# Patient Record
Sex: Female | Born: 1940 | Race: White | Hispanic: No | Smoking: Current every day smoker
Health system: Southern US, Community
[De-identification: ages and names within clinical notes are randomized; demographics above are authoritative.]

## PROBLEM LIST (undated history)

## (undated) DIAGNOSIS — E785 Hyperlipidemia, unspecified: Secondary | ICD-10-CM

## (undated) DIAGNOSIS — C801 Malignant (primary) neoplasm, unspecified: Secondary | ICD-10-CM

## (undated) HISTORY — PX: COLON SURGERY: SHX602

## (undated) HISTORY — PX: CHOLECYSTECTOMY: SHX55

## (undated) HISTORY — PX: APPENDECTOMY: SHX54

## (undated) HISTORY — PX: ABDOMINAL HYSTERECTOMY: SHX81

---

## 2016-05-21 ENCOUNTER — Emergency Department (INDEPENDENT_AMBULATORY_CARE_PROVIDER_SITE_OTHER)
Admission: EM | Admit: 2016-05-21 | Discharge: 2016-05-21 | Disposition: A | Discharge status: Home / Self Care | Payer: Medicare Other | Source: Home / Self Care | Attending: Family Medicine | Admitting: Family Medicine

## 2016-05-21 ENCOUNTER — Encounter: Payer: Self-pay | Admitting: *Deleted

## 2016-05-21 DIAGNOSIS — R300 Dysuria: Secondary | ICD-10-CM

## 2016-05-21 DIAGNOSIS — N898 Other specified noninflammatory disorders of vagina: Secondary | ICD-10-CM

## 2016-05-21 HISTORY — DX: Hyperlipidemia, unspecified: E78.5

## 2016-05-21 LAB — POCT URINALYSIS DIP (MANUAL ENTRY)
Blood, UA: NEGATIVE
Glucose, UA: NEGATIVE
Leukocytes, UA: NEGATIVE
Nitrite, UA: NEGATIVE
Protein Ur, POC: 30 — AB
Spec Grav, UA: 1.025 (ref 1.005–1.03)
Urobilinogen, UA: 0.2 (ref 0–1)
pH, UA: 5.5 (ref 5–8)

## 2016-05-21 MED ORDER — ONDANSETRON HCL 4 MG PO TABS: 4.0000 mg | ORAL_TABLET | Freq: Four times a day (QID) | ORAL | 0 refills | Status: DC

## 2016-05-21 MED ORDER — CEPHALEXIN 500 MG PO CAPS
500.0000 mg | ORAL_CAPSULE | Freq: Two times a day (BID) | ORAL | 0 refills | Status: AC
Start: 2016-05-21 — End: 2016-05-28

## 2016-05-21 MED ORDER — ONDANSETRON 4 MG PO TBDP: 4.0000 mg | ORAL_TABLET | Freq: Once | ORAL | Status: DC

## 2016-05-21 MED ORDER — FLUCONAZOLE 200 MG PO TABS: 200.0000 mg | ORAL_TABLET | Freq: Once | ORAL | 0 refills | Status: AC

## 2016-05-21 NOTE — ED Provider Notes (Signed)
CSN: [id number]     Arrival date & time 05/21/16  1430 History   First MD Initiated Contact with Patient 05/21/16 1500     Chief Complaint  Patient presents with  . Dysuria   (Consider location/radiation/quality/duration/timing/severity/associated sxs/prior Treatment) HPI Shari Hess is a 75 y.o. female presenting to UC with c/o dysuria, nausea, and chills for 6 days as well as scant amount of brown discharge and vaginal burning/irritation.  She did apply vagisil last night with temporary relief.  She reports hx of UTIs in the past several years ago.  Denies fever, vomiting or diarrhea. Denies hematuria.  Denies known hx of yeast infections.  Denies abdominal pain or back pain.   Past Medical History:  Diagnosis Date  . Hyperlipidemia    Past Surgical History:  Procedure Laterality Date  . ABDOMINAL HYSTERECTOMY    . APPENDECTOMY    . CHOLECYSTECTOMY    . COLON SURGERY     History reviewed. No pertinent family history. Social History  Substance Use Topics  . Smoking status: Current Every Day Smoker    Packs/day: 1.00    Types: Cigarettes  . Smokeless tobacco: Never Used  . Alcohol use No   OB History    No data available     Review of Systems  Constitutional: Positive for chills. Negative for appetite change, fatigue and fever.  HENT: Negative for congestion, rhinorrhea and sore throat.   Respiratory: Negative for cough and chest tightness.   Cardiovascular: Negative for chest pain and palpitations.  Gastrointestinal: Positive for nausea. Negative for abdominal pain, diarrhea and vomiting.  Genitourinary: Positive for decreased urine volume, dysuria, frequency, urgency, vaginal discharge and vaginal pain ( burning, irritation). Negative for hematuria, pelvic pain and vaginal bleeding.  Musculoskeletal: Negative for back pain and myalgias.    Allergies  Codeine  Home Medications   Prior to Admission medications   Medication Sig Start Date End Date Taking?  Authorizing Provider  acetaminophen (TYLENOL) 325 MG tablet Take 650 mg by mouth every 6 (six) hours as needed.   Yes Historical Provider, MD  atorvastatin (LIPITOR) 40 MG tablet Take 40 mg by mouth daily.   Yes Historical Provider, MD  clonazePAM (KLONOPIN) 0.5 MG tablet Take 0.5 mg by mouth 2 (two) times daily as needed for anxiety.   Yes Historical Provider, MD  mirtazapine (REMERON) 15 MG tablet Take 15 mg by mouth at bedtime.   Yes Historical Provider, MD  cephALEXin (KEFLEX) 500 MG capsule Take 1 capsule (500 mg total) by mouth 2 (two) times daily. 05/21/16 05/28/16  Noland Fordyce, PA-C  fluconazole (DIFLUCAN) 200 MG tablet Take 1 tablet (200 mg total) by mouth once. 05/21/16 05/21/16  Noland Fordyce, PA-C  ondansetron (ZOFRAN) 4 MG tablet Take 1 tablet (4 mg total) by mouth every 6 (six) hours. 05/21/16   Noland Fordyce, PA-C   Meds Ordered and Administered this Visit   Medications  ondansetron (ZOFRAN-ODT) disintegrating tablet 4 mg (not administered)    BP 128/76 (BP Location: Left Arm)   Pulse 92   Temp 98 F (36.7 C) (Oral)   Resp 18   Ht 5\' 6"  (1.676 m)   Wt 131 lb (59.4 kg)   SpO2 95%   BMI 21.14 kg/m  No data found.   Physical Exam  Constitutional: She is oriented to person, place, and time. She appears well-developed and well-nourished. No distress.  Pt sitting in exam chair, NAD. Appears well, non-toxic.  HENT:  Head: Normocephalic and atraumatic.  Eyes: EOM  are normal.  Neck: Normal range of motion.  Cardiovascular: Normal rate and regular rhythm.   Pulmonary/Chest: Effort normal and breath sounds normal. No respiratory distress. She has no wheezes. She has no rales.  Abdominal: Soft. She exhibits no distension. There is no tenderness. There is no CVA tenderness.  Musculoskeletal: Normal range of motion.  Neurological: She is alert and oriented to person, place, and time.  Skin: Skin is warm and dry. She is not diaphoretic.  Psychiatric: She has a normal mood and  affect. Her behavior is normal.  Nursing note and vitals reviewed.   Urgent Care Course   Clinical Course    Procedures (including critical care time)  Labs Review Labs Reviewed  POCT URINALYSIS DIP (MANUAL ENTRY) - Abnormal; Notable for the following:       Result Value   Bilirubin, UA small (*)    Ketones, POC UA trace (5) (*)    Protein Ur, POC =30 (*)    All other components within normal limits  URINE CULTURE    Imaging Review No results found.    MDM   1. Dysuria   2. Vaginal irritation    Pt c/o 6 days of vaginal irritation and dysuria as well as chills and nausea.  UA: small amount of protein and bilirubin, trace ketones Will send urine culture. Will start pt on antibiotics as urine culture pending as pt is symptomatic at this time. Will also cover for potential vaginal yeast infection. Rx: Keflex and fluconazole 200mg  tab once.  Encouraged good hydration. F/u with PCP in 3-4 days or return to [facility] if needed if symptoms not improving, sooner if worsening. Patient verbalized understanding and agreement with treatment plan.     Noland Fordyce, PA-C 05/21/16 1614

## 2016-05-21 NOTE — ED Triage Notes (Signed)
Pt c/o dysuria, nausea and chills x 6 days. She reports some brown d/c. She has applied vagisil cream with temporary relief.

## 2016-05-23 ENCOUNTER — Telehealth: Payer: Self-pay | Admitting: *Deleted

## 2016-05-23 LAB — URINE CULTURE: Organism ID, Bacteria: NO GROWTH

## 2016-05-23 NOTE — Telephone Encounter (Signed)
Callback: UCX results given and discussed. She is improving. May stop antibiotic as symptoms possibly caused by yeast infection.

## 2017-02-10 ENCOUNTER — Emergency Department (INDEPENDENT_AMBULATORY_CARE_PROVIDER_SITE_OTHER)
Admission: EM | Admit: 2017-02-10 | Discharge: 2017-02-10 | Disposition: A | Discharge status: Home / Self Care | Payer: Medicare Other | Source: Home / Self Care | Attending: Family Medicine | Admitting: Family Medicine

## 2017-02-10 ENCOUNTER — Encounter: Payer: Self-pay | Admitting: *Deleted

## 2017-02-10 DIAGNOSIS — R1084 Generalized abdominal pain: Secondary | ICD-10-CM | POA: Diagnosis not present

## 2017-02-10 DIAGNOSIS — N898 Other specified noninflammatory disorders of vagina: Secondary | ICD-10-CM

## 2017-02-10 DIAGNOSIS — R102 Pelvic and perineal pain: Secondary | ICD-10-CM

## 2017-02-10 HISTORY — DX: Malignant (primary) neoplasm, unspecified: C80.1

## 2017-02-10 NOTE — ED Provider Notes (Signed)
CSN: [id number]     Arrival date & time 02/10/17  1341 History   None    Chief Complaint  Patient presents with  . Abdominal Pain  . Vaginal Discharge  . Nausea   (Consider location/radiation/quality/duration/timing/severity/associated sxs/prior Treatment) HPI  Shari Hess is a 76 y.o. female with hx of Right lower lobe lung cancer presenting to UC with c/o 2 weeks of gradually worsening diffuse abdominal pain that started on Left flank, radiated across left side of abdomen, now starting on Right side of abdomen.  She completed her radiation treatment end of March, early April of this year and is scheduled to have a repeat PET scan at the end of June but pain became too severe to wait.  She also reports pressure and discomfort in her pelvic region and irritation when wiping in vaginal area when washing herself or after urination.  The pelvic pressure and vaginal irritation has been for about 3-4 months. Her PCP recommended she f/u with a GYN but she has not been able to schedule an appointment yet.  Denies fever, chills, vomiting or diarrhea but does have nausea.     Past Medical History:  Diagnosis Date  . Cancer ([place])    lung CA  . Hyperlipidemia    Past Surgical History:  Procedure Laterality Date  . ABDOMINAL HYSTERECTOMY    . APPENDECTOMY    . CHOLECYSTECTOMY    . COLON SURGERY     Family History  Problem Relation Age of Onset  . Stroke Mother   . Diabetes Father   . Heart attack Father    Social History  Substance Use Topics  . Smoking status: Current Every Day Smoker    Packs/day: 0.50    Types: Cigarettes  . Smokeless tobacco: Never Used  . Alcohol use No   OB History    No data available     Review of Systems  Constitutional: Negative for chills and fever.  HENT: Negative for congestion, ear pain, sore throat, trouble swallowing and voice change.   Respiratory: Negative for cough and shortness of breath.   Cardiovascular: Negative for chest pain and  palpitations.  Gastrointestinal: Positive for abdominal pain and nausea. Negative for diarrhea and vomiting.  Genitourinary: Positive for dysuria, flank pain (Left), genital sores (discomfort, irritation when wiping), pelvic pain (pressure) and vaginal pain. Negative for decreased urine volume, hematuria and urgency.  Musculoskeletal: Negative for arthralgias, back pain and myalgias.  Skin: Negative for rash.    Allergies  Codeine  Home Medications   Prior to Admission medications   Medication Sig Start Date End Date Taking? Authorizing Provider  acetaminophen (TYLENOL) 325 MG tablet Take 650 mg by mouth every 6 (six) hours as needed.   Yes [provider]  aspirin EC 81 MG tablet Take 81 mg by mouth daily.   Yes [provider]  atorvastatin (LIPITOR) 40 MG tablet Take 40 mg by mouth daily.   Yes [provider]  clonazePAM (KLONOPIN) 0.5 MG tablet Take 0.5 mg by mouth 2 (two) times daily as needed for anxiety.   Yes [provider]   Meds Ordered and Administered this Visit  Medications - No data to display  BP 123/73 (BP Location: Left Arm)   Pulse 99   Temp 98.4 F (36.9 C) (Oral)   Wt 125 lb (56.7 kg)   SpO2 95%   BMI 20.18 kg/m  No data found.   Physical Exam  Constitutional: She is oriented to person, place, and  time. She appears well-developed and well-nourished. No distress.  HENT:  Head: Normocephalic and atraumatic.  Mouth/Throat: Oropharynx is clear and moist.  Eyes: EOM are normal.  Neck: Normal range of motion.  Cardiovascular: Normal rate and regular rhythm.   Pulmonary/Chest: Effort normal. No respiratory distress. She has no wheezes. She has no rales.  Abdominal: Soft. Bowel sounds are normal. She exhibits no distension and no mass. There is tenderness. There is guarding. There is no rebound and no CVA tenderness.    Soft, non-distended. Diffuse tenderness across abdomen.   Musculoskeletal: Normal range of motion.    Neurological: She is alert and oriented to person, place, and time.  Skin: Skin is warm and dry. No rash noted. She is not diaphoretic. No erythema.  Psychiatric: She has a normal mood and affect. Her behavior is normal.  Nursing note and vitals reviewed.   Urgent Care Course     Procedures (including critical care time)  Labs Review Labs Reviewed - No data to display  Imaging Review No results found.   MDM   1. Generalized abdominal pain   2. Pelvic pain   3. Vaginal irritation    Pt c/o diffuse abdominal pain with nausea.  Diffuse tenderness to abdomen on exam.  Recommend pt go to emergency department for further evaluation, likely via CT scan and pelvic exam.  Pt agreeable with plan.     Noland Fordyce, PA-C 02/10/17 1450

## 2017-02-10 NOTE — ED Triage Notes (Signed)
Patient c/o 2 weeks of superficial upper abdominal pain now on both sides. Constant nausea. Absence of rash. Pain is described as sharp. She also c/o vaginal pain and brown discharge. She saw her PCP 6 days ago who she reports advised her to see a GYN.

## 2017-02-11 ENCOUNTER — Telehealth: Payer: Self-pay | Admitting: *Deleted

## 2017-02-11 NOTE — Telephone Encounter (Signed)
Callback: Patient reports she did go to [facility] ER. Reports "they didn't find anything".They did a scan and labs. She does not know results yet. Prescribed lidocaine patches. She has an apt with a GYn in 1 1/2 weeks

## 2017-08-03 ENCOUNTER — Emergency Department (INDEPENDENT_AMBULATORY_CARE_PROVIDER_SITE_OTHER): Payer: Medicare Other

## 2017-08-03 ENCOUNTER — Encounter: Payer: Self-pay | Admitting: Emergency Medicine

## 2017-08-03 ENCOUNTER — Emergency Department (INDEPENDENT_AMBULATORY_CARE_PROVIDER_SITE_OTHER)
Admission: EM | Admit: 2017-08-03 | Discharge: 2017-08-03 | Disposition: A | Discharge status: Home / Self Care | Payer: Medicare Other | Source: Home / Self Care | Attending: Family Medicine | Admitting: Family Medicine

## 2017-08-03 DIAGNOSIS — J449 Chronic obstructive pulmonary disease, unspecified: Secondary | ICD-10-CM

## 2017-08-03 DIAGNOSIS — J029 Acute pharyngitis, unspecified: Secondary | ICD-10-CM

## 2017-08-03 DIAGNOSIS — J441 Chronic obstructive pulmonary disease with (acute) exacerbation: Secondary | ICD-10-CM | POA: Diagnosis not present

## 2017-08-03 MED ORDER — PREDNISONE 20 MG PO TABS: ORAL_TABLET | ORAL | 0 refills | Status: DC

## 2017-08-03 MED ORDER — AZITHROMYCIN 250 MG PO TABS: 250.0000 mg | ORAL_TABLET | Freq: Every day | ORAL | 0 refills | Status: DC

## 2017-08-03 NOTE — ED Triage Notes (Signed)
Patient presents to [facility] with C/O pain in the right rib area, times two weeks , she advises that she has had a productive cough with greenish yellow sputum. Nasal drainage, achy and generalized weakness. Dx of lung Cancer, was taking radiation until March. Patient continues to smoke 1/2 pack of cigarettes per day.

## 2017-08-03 NOTE — ED Provider Notes (Signed)
[name]    CSN: [id number] Arrival date & time: 08/03/17  1446     History   Chief Complaint Chief Complaint  Patient presents with  . Cough  . Lung Cancer    HPI Shari Hess is a 76 y.o. female.   HPI  Shari Hess is a 76 y.o. female presenting to UC with hx of COPD and lung cancer c/o worsening productive cough with greenish-yellow sputum for 2 weeks.  Associated sore throat, nasal drainage, generalized weakness and body aches.  She has taken OTC medications and her albuterol inhaler with mild relief.  Her last radiation treatment was in March 2018.  Last visit with oncology was in Sept 2018, a chest CT showed continued reduction in size of primary Right upper lobe malignant nodule.  Pt still smokes cigarettes daily but states she has not been able to smoke recently due to her chest congestion and cough.     Past Medical History:  Diagnosis Date  . Cancer ([place])    lung CA  . Hyperlipidemia     There are no active problems to display for this patient.   Past Surgical History:  Procedure Laterality Date  . ABDOMINAL HYSTERECTOMY    . APPENDECTOMY    . CHOLECYSTECTOMY    . COLON SURGERY      OB History    No data available       Home Medications    Prior to Admission medications   Medication Sig Start Date End Date Taking? Authorizing Provider  acetaminophen (TYLENOL) 325 MG tablet Take 650 mg by mouth every 6 (six) hours as needed.    [provider]  aspirin EC 81 MG tablet Take 81 mg by mouth daily.    [provider]  atorvastatin (LIPITOR) 40 MG tablet Take 40 mg by mouth daily.    [provider]  azithromycin (ZITHROMAX) 250 MG tablet Take 1 tablet (250 mg total) daily by mouth. Take first 2 tablets together, then 1 every day until finished. 08/03/17   Noe Gens, PA-C  clonazePAM (KLONOPIN) 0.5 MG tablet Take 0.5 mg by mouth 2 (two) times daily as needed for anxiety.    [provider]    predniSONE (DELTASONE) 20 MG tablet 3 tabs po day one, then 2 po daily x 4 days 08/03/17   Noe Gens, PA-C    Family History Family History  Problem Relation Age of Onset  . Stroke Mother   . Diabetes Father   . Heart attack Father     Social History Social History   Tobacco Use  . Smoking status: Current Every Day Smoker    Packs/day: 0.50    Types: Cigarettes  . Smokeless tobacco: Never Used  Substance Use Topics  . Alcohol use: No  . Drug use: No     Allergies   Codeine   Review of Systems Review of Systems  Constitutional: Positive for appetite change, fatigue and fever. Negative for chills.  HENT: Positive for congestion, rhinorrhea and sore throat. Negative for ear pain, trouble swallowing and voice change.   Respiratory: Positive for cough, chest tightness and shortness of breath.   Cardiovascular: Positive for chest pain (Right side). Negative for palpitations.  Gastrointestinal: Negative for abdominal pain, diarrhea, nausea and vomiting.  Musculoskeletal: Positive for arthralgias and myalgias. Negative for back pain.  Skin: Negative for rash.     Physical Exam Triage Vital Signs ED Triage Vitals  Enc Vitals Group  BP 08/03/17 1516 109/71     Pulse Rate 08/03/17 1516 92     Resp 08/03/17 1516 16     Temp 08/03/17 1516 98.1 F (36.7 C)     Temp Source 08/03/17 1516 Oral     SpO2 08/03/17 1516 96 %     Weight 08/03/17 1520 120 lb (54.4 kg)     Height 08/03/17 1520 5\' 6"  (1.676 m)     Head Circumference --      Peak Flow --      Pain Score 08/03/17 1520 8     Pain Loc --      Pain Edu? --      Excl. in [place]? --    No data found.  Updated Vital Signs BP 109/71 (BP Location: Right Arm)   Pulse 92   Temp 98.1 F (36.7 C) (Oral)   Resp 16   Ht 5\' 6"  (1.676 m)   Wt 120 lb (54.4 kg)   SpO2 96%   BMI 19.37 kg/m   Visual Acuity Right Eye Distance:   Left Eye Distance:   Bilateral Distance:    Right Eye Near:   Left Eye Near:     Bilateral Near:     Physical Exam  Constitutional: She is oriented to person, place, and time. She appears well-developed and well-nourished. No distress.  HENT:  Head: Normocephalic and atraumatic.  Right Ear: Tympanic membrane normal.  Left Ear: Tympanic membrane normal.  Nose: Mucosal edema present. Right sinus exhibits no maxillary sinus tenderness and no frontal sinus tenderness. Left sinus exhibits no maxillary sinus tenderness and no frontal sinus tenderness.  Mouth/Throat: Uvula is midline and mucous membranes are normal. Posterior oropharyngeal erythema present. No oropharyngeal exudate, posterior oropharyngeal edema or tonsillar abscesses.  Eyes: EOM are normal.  Neck: Normal range of motion. Neck supple.  Cardiovascular: Normal rate and regular rhythm.  Pulmonary/Chest: Effort normal. No stridor. No respiratory distress. She has wheezes. She has rhonchi. She has no rales. She exhibits tenderness ( right lateral side. no deformity or crepitus).  Diffuse wheeze and rhonchi but no accessory muscle use. Able to speak in full sentences   Musculoskeletal: Normal range of motion.  Lymphadenopathy:    She has no cervical adenopathy.  Neurological: She is alert and oriented to person, place, and time.  Skin: Skin is warm and dry. She is not diaphoretic.  Psychiatric: She has a normal mood and affect. Her behavior is normal.  Nursing note and vitals reviewed.    UC Treatments / Results  Labs (all labs ordered are listed, but only abnormal results are displayed) Labs Reviewed - No data to display  EKG  EKG Interpretation None       Radiology Dg Chest 2 View  Result Date: 08/03/2017 CLINICAL DATA:  Productive cough, fatigue, shortness of Breath EXAM: CHEST  2 VIEW COMPARISON:  None FINDINGS: There is hyperinflation of the lungs compatible with COPD. Heart and mediastinal contours are within normal limits. No focal opacities or effusions. No acute bony abnormality.  IMPRESSION: COPD.  No active disease. Electronically Signed   By: Rolm Baptise M.D.   On: 08/03/2017 15:49    Procedures Procedures (including critical care time)  Medications Ordered in UC Medications - No data to display   Initial Impression / Assessment and Plan / UC Course  I have reviewed the triage vital signs and the nursing notes.  Pertinent labs & imaging results that were available during my care of the patient were  reviewed by me and considered in my medical decision making (see chart for details).     Hx and exam c/w COPD exacerbation, however, due to persistence of productive green-yellow sputum and sore throat, will cover for underlying bacterial infection Reassured pt of no pneumonia Encouraged f/u with PCP in 1 week if needed. Discussed symptoms that warrant emergent care in the ED.   Final Clinical Impressions(s) / UC Diagnoses   Final diagnoses:  COPD exacerbation ([place])  Sore throat    ED Discharge Orders        Ordered    azithromycin (ZITHROMAX) 250 MG tablet  Daily     08/03/17 1604    predniSONE (DELTASONE) 20 MG tablet     08/03/17 1604       Controlled Substance Prescriptions [place] Controlled Substance Registry consulted? Not Applicable   Tyrell Antonio 08/03/17 7062

## 2017-08-05 ENCOUNTER — Telehealth: Payer: Self-pay

## 2017-08-05 NOTE — Telephone Encounter (Signed)
Left message to call UC if any questions or concerns.

## 2018-06-14 IMAGING — DX DG CHEST 2V
2 series · 2 of 2 positions shown · non-contrast
Comparison: None

CLINICAL DATA: Productive cough, fatigue, shortness of Breath

EXAM:
CHEST  2 VIEW

[chest pa]
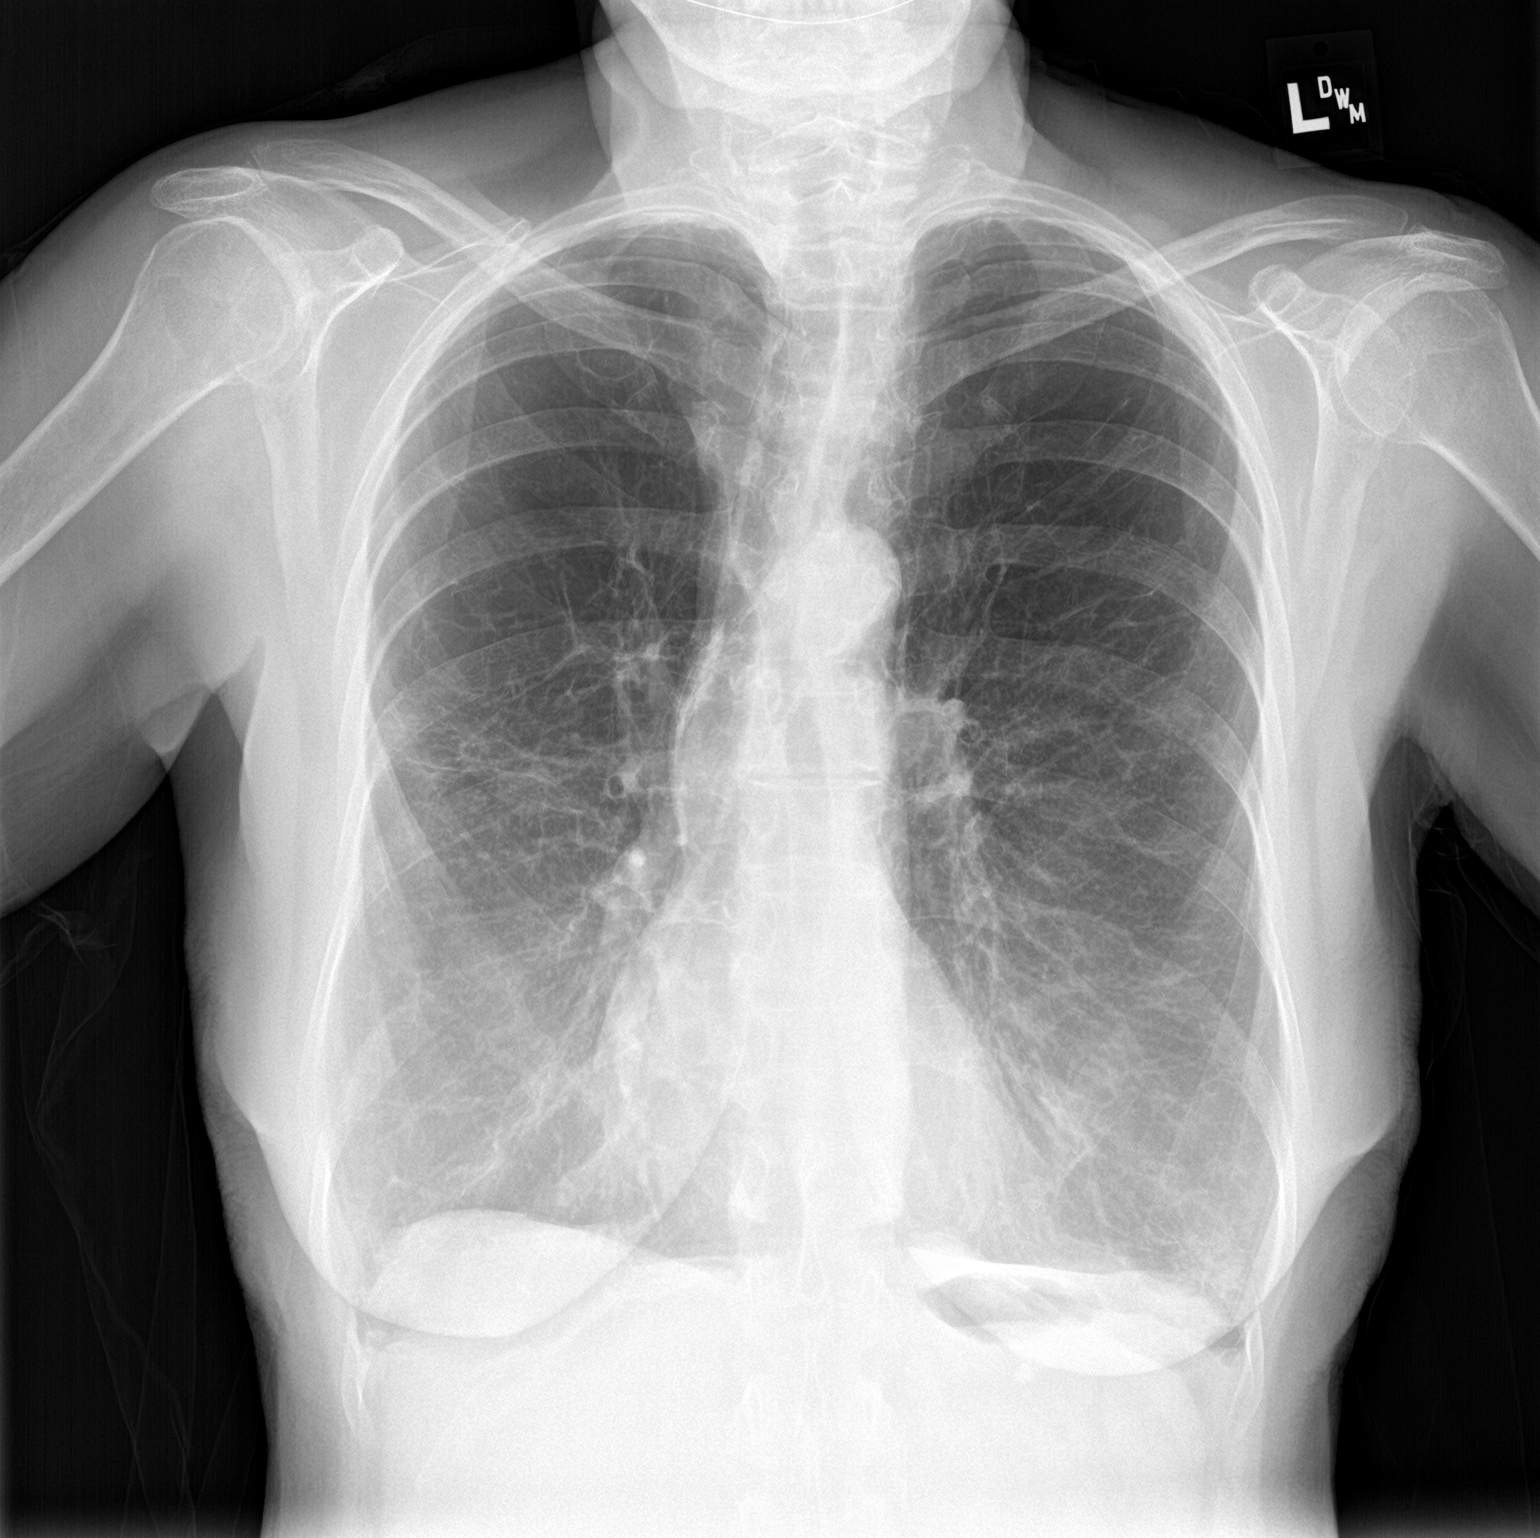

[chest lat]
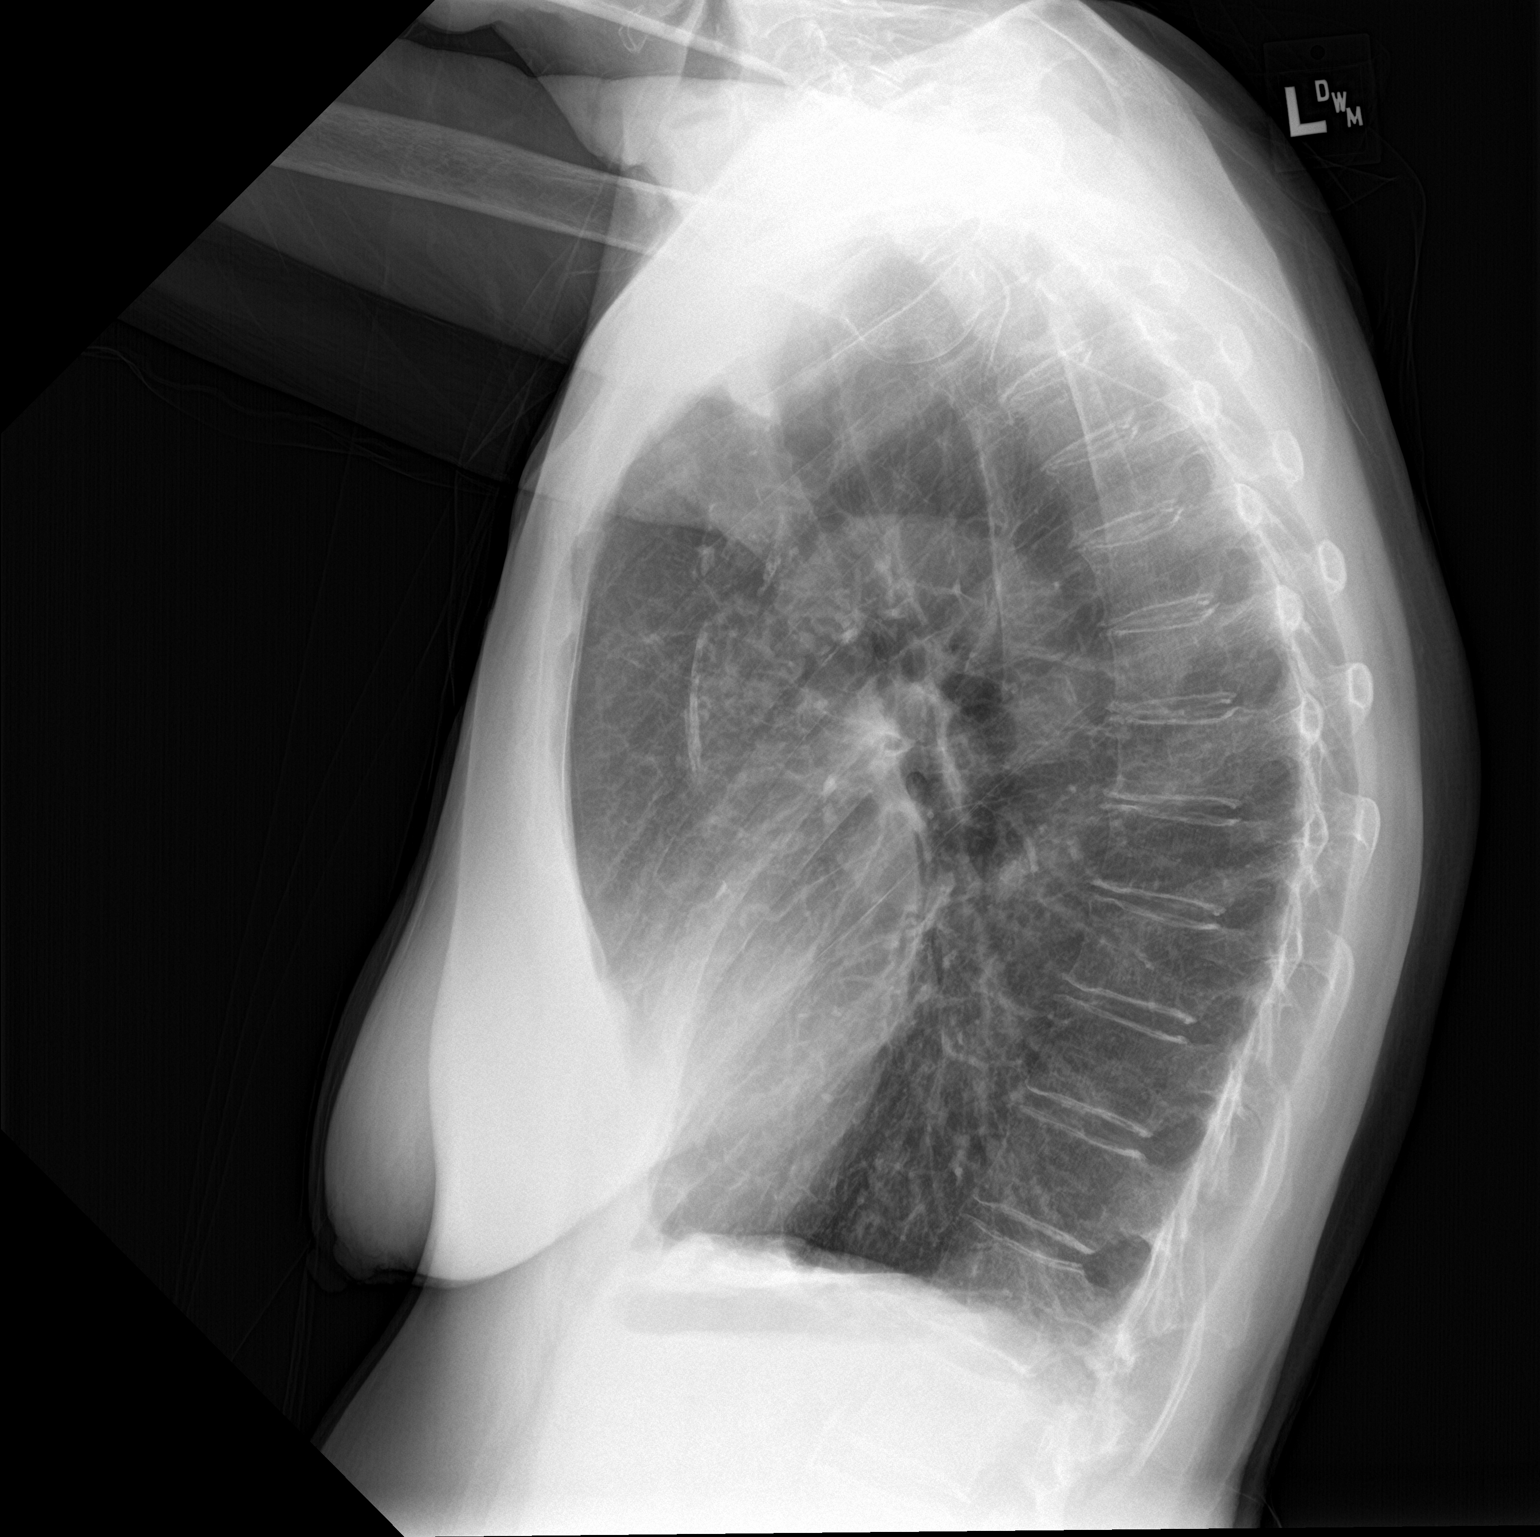

[2 of 2 positions shown; findings below may reference images not displayed]

FINDINGS: There is hyperinflation of the lungs compatible with COPD. Heart and
mediastinal contours are within normal limits. No focal opacities or
effusions. No acute bony abnormality.
IMPRESSION: COPD.  No active disease.

## 2018-12-25 ENCOUNTER — Other Ambulatory Visit: Payer: Self-pay

## 2018-12-25 ENCOUNTER — Encounter: Payer: Self-pay | Admitting: Emergency Medicine

## 2018-12-25 ENCOUNTER — Emergency Department (INDEPENDENT_AMBULATORY_CARE_PROVIDER_SITE_OTHER)
Admission: EM | Admit: 2018-12-25 | Discharge: 2018-12-25 | Disposition: A | Discharge status: Home / Self Care | Payer: Medicare Other | Source: Home / Self Care | Attending: Family Medicine | Admitting: Family Medicine

## 2018-12-25 DIAGNOSIS — R300 Dysuria: Secondary | ICD-10-CM

## 2018-12-25 DIAGNOSIS — J029 Acute pharyngitis, unspecified: Secondary | ICD-10-CM

## 2018-12-25 LAB — POCT URINALYSIS DIP (MANUAL ENTRY)
Bilirubin, UA: NEGATIVE
Glucose, UA: NEGATIVE mg/dL
Ketones, POC UA: NEGATIVE mg/dL
Nitrite, UA: NEGATIVE
Protein Ur, POC: NEGATIVE mg/dL
Spec Grav, UA: 1.01 (ref 1.010–1.025)
Urobilinogen, UA: 0.2 E.U./dL
pH, UA: 6 (ref 5.0–8.0)

## 2018-12-25 LAB — POCT RAPID STREP A (OFFICE): Rapid Strep A Screen: NEGATIVE

## 2018-12-25 MED ORDER — AMOXICILLIN 875 MG PO TABS: 875.0000 mg | ORAL_TABLET | Freq: Two times a day (BID) | ORAL | 0 refills | Status: AC

## 2018-12-25 NOTE — Discharge Instructions (Addendum)
Increase fluid intake. Try warm salt water gargles for sore throat.  May take Tylenol as needed for pain. If symptoms become significantly worse during the night or over the weekend, proceed to the local emergency room.

## 2018-12-25 NOTE — ED Triage Notes (Signed)
Sore throat x 2 weeks Dysuria x 1 day

## 2018-12-25 NOTE — ED Provider Notes (Signed)
[name]    CSN: [id number] Arrival date & time: 12/25/18  1113     History   Chief Complaint Chief Complaint  Patient presents with  . Sore Throat  . Dysuria    HPI Shari Hess is a 78 y.o. female.   Patient states that she developed a sore throat two weeks ago that has persisted, worse on the left.  She has been hoarse, but denies nasal congestion, cough, and fever.  However, during the past 3 days she has developed chills/sweats.  During the past 24 hours she has developed dysuria, hesitancy, and nocturia.  She denies abdominal, flank, or pelvic pain.  The history is provided by the patient.    Past Medical History:  Diagnosis Date  . Cancer ([place])    lung CA  . Hyperlipidemia     There are no active problems to display for this patient.   Past Surgical History:  Procedure Laterality Date  . ABDOMINAL HYSTERECTOMY    . APPENDECTOMY    . CHOLECYSTECTOMY    . COLON SURGERY      OB History   No obstetric history on file.      Home Medications    Prior to Admission medications   Medication Sig Start Date End Date Taking? Authorizing Provider  albuterol (PROVENTIL) (2.5 MG/3ML) 0.083% nebulizer solution Take 2.5 mg by nebulization every 6 (six) hours as needed for wheezing or shortness of breath.   Yes [provider]  budesonide-formoterol (SYMBICORT) 160-4.5 MCG/ACT inhaler Inhale 2 puffs into the lungs 2 (two) times daily.   Yes [provider]  mirtazapine (REMERON) 15 MG tablet Take 15 mg by mouth at bedtime.   Yes [provider]  omeprazole (PRILOSEC) 10 MG capsule Take 10 mg by mouth daily.   Yes [provider]  acetaminophen (TYLENOL) 325 MG tablet Take 650 mg by mouth every 6 (six) hours as needed.    [provider]  amoxicillin (AMOXIL) 875 MG tablet Take 1 tablet (875 mg total) by mouth 2 (two) times daily. 12/25/18   Kandra Nicolas, MD  aspirin EC 81 MG tablet Take 81 mg by mouth  daily.    [provider]  atorvastatin (LIPITOR) 40 MG tablet Take 40 mg by mouth daily.    [provider]  clonazePAM (KLONOPIN) 0.5 MG tablet Take 0.5 mg by mouth 2 (two) times daily as needed for anxiety.    [provider]    Family History Family History  Problem Relation Age of Onset  . Stroke Mother   . Diabetes Father   . Heart attack Father     Social History Social History   Tobacco Use  . Smoking status: Current Every Day Smoker    Packs/day: 0.50    Types: Cigarettes  . Smokeless tobacco: Never Used  Substance Use Topics  . Alcohol use: No  . Drug use: No     Allergies   Codeine   Review of Systems Review of Systems + sore throat No cough No pleuritic pain No wheezing No nasal congestion ? post-nasal drainage No sinus pain/pressure No itchy/red eyes No earache No hemoptysis No SOB No fever, + chills/sweats No nausea No vomiting No abdominal pain No diarrhea + urinary symptoms No skin rash + fatigue No myalgias No headache    Physical Exam Triage Vital Signs ED Triage Vitals  Enc Vitals Group     BP 12/25/18 1148 (!) 165/84     Pulse Rate  12/25/18 1148 (!) 119     Resp --      Temp 12/25/18 1148 97.8 F (36.6 C)     Temp Source 12/25/18 1148 Oral     SpO2 12/25/18 1148 95 %     Weight 12/25/18 1149 119 lb (54 kg)     Height 12/25/18 1149 5\' 5"  (1.651 m)     Head Circumference --      Peak Flow --      Pain Score 12/25/18 1149 8     Pain Loc --      Pain Edu? --      Excl. in [place]? --    No data found.  Updated Vital Signs BP (!) 165/84 (BP Location: Right Arm)   Pulse (!) 119   Temp 97.8 F (36.6 C) (Oral)   Ht 5\' 5"  (1.651 m)   Wt 54 kg   SpO2 95%   BMI 19.80 kg/m   Visual Acuity Right Eye Distance:   Left Eye Distance:   Bilateral Distance:    Right Eye Near:   Left Eye Near:    Bilateral Near:     Physical Exam Nursing notes and Vital Signs reviewed. Appearance:  Patient  appears stated age, and in no acute distress Eyes:  Pupils are equal, round, and reactive to light and accomodation.  Extraocular movement is intact.  Conjunctivae are not inflamed  Ears:  Canals normal.  Tympanic membranes normal.  Nose:  Mildly congested turbinates.  No sinus tenderness.  Pharynx:  Minimal erythema Neck:  Supple. No adenopathy. Lungs:  Clear to auscultation.  Breath sounds are equal.  Moving air well. Heart:  Regular rate and rhythm without murmurs, rubs, or gallops.  Abdomen:  Nontender without masses or hepatosplenomegaly.  Bowel sounds are present.  No CVA or flank tenderness.  Extremities:  No edema.  Skin:  No rash present.      UC Treatments / Results  Labs (all labs ordered are listed, but only abnormal results are displayed) Labs Reviewed  POCT URINALYSIS DIP (MANUAL ENTRY) - Abnormal; Notable for the following components:      Result Value   Color, UA light yellow (*)    Blood, UA trace-lysed (*)    Leukocytes, UA Trace (*)    All other components within normal limits  URINE CULTURE  STREP A DNA PROBE  POCT RAPID STREP A (OFFICE) negativ    EKG None  Radiology No results found.  Procedures Procedures (including critical care time)  Medications Ordered in UC Medications - No data to display  Initial Impression / Assessment and Plan / UC Course  I have reviewed the triage vital signs and the nursing notes.  Pertinent labs & imaging results that were available during my care of the patient were reviewed by me and considered in my medical decision making (see chart for details).    Begin empiric amoxicillin. Urine culture pending. Followup with Family Doctor if not improved in one week.    Final Clinical Impressions(s) / UC Diagnoses   Final diagnoses:  Acute pharyngitis, unspecified etiology  Dysuria     Discharge Instructions     Increase fluid intake. Try warm salt water gargles for sore throat.  May take Tylenol as needed for  pain. If symptoms become significantly worse during the night or over the weekend, proceed to the local emergency room.     ED Prescriptions    Medication Sig Dispense Auth. Provider   amoxicillin (AMOXIL) 875 MG  tablet Take 1 tablet (875 mg total) by mouth 2 (two) times daily. 20 tablet Kandra Nicolas, MD        Kandra Nicolas, MD 01/03/19 2216

## 2018-12-26 ENCOUNTER — Telehealth: Payer: Self-pay | Admitting: Emergency Medicine

## 2018-12-26 LAB — STREP A DNA PROBE: Group A Strep Probe: NOT DETECTED

## 2018-12-26 NOTE — Telephone Encounter (Signed)
Spoke with patient and told her strep dna negative; urine culture results pending. She is having no bladder discomfort today but her throat is very sore; she wonders if it could be returning cancer from past years site in lung. I encouraged her to follow treatment suggestions and see if she was not feeling better in another 24 hours; to make appt. with her pcp for follow up on throat fears; to go to ER if she is getting worse by tomorrow since we will be closed.

## 2018-12-27 LAB — URINE CULTURE
MICRO NUMBER:: 388701
SPECIMEN QUALITY:: ADEQUATE

## 2018-12-28 NOTE — Telephone Encounter (Signed)
Please encourage pt to keep taking her prescribed antibiotic. Encourage good hydration and f/u with PCP for urine recheck later this week if not improving.

## 2018-12-28 NOTE — Telephone Encounter (Signed)
Notified patient of results, and to follow up with PCP if not better.

## 2021-08-16 DEATH — deceased
